# Patient Record
Sex: Female | Born: 1985 | Race: Black or African American | Hispanic: No | Marital: Single | Smoking: Never smoker
Health system: Southern US, Community
[De-identification: ages and names within clinical notes are randomized; demographics above are authoritative.]

---
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
## 2019-02-28 ENCOUNTER — Emergency Department
Admission: EM | Admit: 2019-02-28 | Discharge: 2019-02-28 | Disposition: A | Discharge status: Home / Self Care | Payer: Self-pay | Attending: Emergency Medicine | Admitting: Emergency Medicine

## 2019-02-28 ENCOUNTER — Other Ambulatory Visit: Payer: Self-pay

## 2019-02-28 DIAGNOSIS — Z203 Contact with and (suspected) exposure to rabies: Secondary | ICD-10-CM | POA: Insufficient documentation

## 2019-02-28 DIAGNOSIS — Z23 Encounter for immunization: Secondary | ICD-10-CM | POA: Insufficient documentation

## 2019-02-28 DIAGNOSIS — Z209 Contact with and (suspected) exposure to unspecified communicable disease: Secondary | ICD-10-CM | POA: Insufficient documentation

## 2019-02-28 MED ORDER — RABIES VACCINE, PCEC IM SUSR
1.0000 mL | Freq: Once | INTRAMUSCULAR | Status: AC
  Administered 2019-02-28: 11:00:00 1 mL via INTRAMUSCULAR
  Filled 2019-02-28: qty 1

## 2019-02-28 MED ORDER — RABIES IMMUNE GLOBULIN 150 UNIT/ML IM INJ
20.0000 [IU]/kg | INJECTION | Freq: Once | INTRAMUSCULAR | Status: AC
  Administered 2019-02-28: 1275 [IU] via INTRAMUSCULAR
  Filled 2019-02-28: qty 8.5

## 2019-02-28 NOTE — ED Triage Notes (Signed)
Pt states she is concerned due to a bat being in her room last night, it not sure if she was bitten or anything.

## 2019-02-28 NOTE — ED Notes (Signed)
See triage note  States she saw a bat fly out of her room   States she was not in the room and is not sure if she was exposed to anything  Was referred by health dept

## 2019-02-28 NOTE — Discharge Instructions (Addendum)
You are being treated empirically for a potential rabies exposure. You should return to the ED or [facility] according to the following schedule:  Day 0  (9/15) [facility] - Vaccine #1 + Immunoglobulin - Done Day 3  (9/18) - Vaccine #2 Day 7  (9/22) - Vaccine #3 Day 14 (9/29) - Vaccine #4

## 2019-02-28 NOTE — ED Provider Notes (Signed)
[facility] Emergency Department Provider Note ____________________________________________  Time seen: 67  I have reviewed the triage vital signs and the nursing notes.  HISTORY  Chief Complaint  Animal Bite  HPI Kathleen Stark is a 33 y.o. female presents with son to the ED with request for prophylactic rabies vaccine.  Patient had an exposure to a bat found in her bedroom over the weekend.  The bat was subsequently captured, not handled, but immediately released.  Patient does not have any evidence that she was bitten by the bat.  The bat is unavailable for testing at this time.  Patient was advised by the health department to present to the ED for exposure counseling.   History reviewed. No pertinent past medical history.  There are no active problems to display for this patient.   Past Surgical History:  Procedure Laterality Date  . CYST EXCISION  2006    Prior to Admission medications   Not on File    Allergies Patient has no known allergies.  No family history on file.  Social History Social History   Tobacco Use  . Smoking status: Never Smoker  . Smokeless tobacco: Never Used  Substance Use Topics  . Alcohol use: Yes  . Drug use: Not Currently    Review of Systems  Constitutional: Negative for fever. Eyes: Negative for visual changes. ENT: Negative for sore throat. Cardiovascular: Negative for chest pain. Respiratory: Negative for shortness of breath. Gastrointestinal: Negative for abdominal pain, vomiting and diarrhea. Genitourinary: Negative for dysuria. Musculoskeletal: Negative for back pain. Skin: Negative for rash. Neurological: Negative for headaches, focal weakness or numbness. ____________________________________________  PHYSICAL EXAM:  VITAL SIGNS: ED Triage Vitals  Enc Vitals Group     BP 02/28/19 0946 (!) 130/92     Pulse Rate 02/28/19 0946 66     Resp 02/28/19 0946 18     Temp 02/28/19 0946 98.5 F (36.9  C)     Temp Source 02/28/19 0946 Oral     SpO2 02/28/19 0946 100 %     Weight 02/28/19 0946 140 lb (63.5 kg)     Height 02/28/19 0946 5\' 1"  (1.549 m)     Head Circumference --      Peak Flow --      Pain Score 02/28/19 0950 0     Pain Loc --      Pain Edu? --      Excl. in [place]? --     Constitutional: Alert and oriented. Well appearing and in no distress. Head: Normocephalic and atraumatic. Eyes: Conjunctivae are normal. Normal extraocular movements Cardiovascular: Normal rate, regular rhythm. Normal distal pulses. Respiratory: Normal respiratory effort. No wheezes/rales/rhonchi. Musculoskeletal: Nontender with normal range of motion in all extremities.  Neurologic:  Normal gait without ataxia. Normal speech and language. No gross focal neurologic deficits are appreciated. Skin:  Skin is warm, dry and intact. No rash noted. ____________________________________________  PROCEDURES  Rabavert Ig 1275 units IM Rabavert 1 ml IM Procedures ____________________________________________  INITIAL IMPRESSION / ASSESSMENT AND PLAN / ED COURSE  Patient with ED evaluation for postexposure prophylaxis of rabies secondary to a bat exposure.  Patient denies any evidence of being bitten by the bat.  She has been counseled on rabies exposure and treatment.  She agrees to prophylax at this time.  She will follow-up as scheduled for subsequent vaccines.  Kathleen Stark was evaluated in Emergency Department on 02/28/2019 for the symptoms described in the history of present illness. She was evaluated in  the context of the global COVID-19 pandemic, which necessitated consideration that the patient might be at risk for infection with the SARS-CoV-2 virus that causes COVID-19. Institutional protocols and algorithms that pertain to the evaluation of patients at risk for COVID-19 are in a state of rapid change based on information released by regulatory bodies including the CDC and federal and state organizations.  These policies and algorithms were followed during the patient's care in the ED. ____________________________________________  FINAL CLINICAL IMPRESSION(S) / ED DIAGNOSES  Final diagnoses:  Need for post exposure prophylaxis for rabies  Exposure to bat without known bite      Carmie End, Dannielle Karvonen, PA-C 02/28/19 1028    Blake Divine, MD 03/01/19 [phone]

## 2019-03-03 ENCOUNTER — Encounter: Payer: Self-pay | Admitting: Emergency Medicine

## 2019-03-03 ENCOUNTER — Ambulatory Visit
Admission: EM | Admit: 2019-03-03 | Discharge: 2019-03-03 | Disposition: A | Discharge status: Home / Self Care | Payer: Self-pay

## 2019-03-03 ENCOUNTER — Other Ambulatory Visit: Payer: Self-pay

## 2019-03-03 DIAGNOSIS — Z23 Encounter for immunization: Secondary | ICD-10-CM

## 2019-03-03 DIAGNOSIS — Z203 Contact with and (suspected) exposure to rabies: Secondary | ICD-10-CM

## 2019-03-03 MED ORDER — RABIES VACCINE, PCEC IM SUSR
1.0000 mL | Freq: Once | INTRAMUSCULAR | Status: AC
  Administered 2019-03-03: 1 mL via INTRAMUSCULAR

## 2019-03-03 NOTE — ED Triage Notes (Signed)
Pt presents to [facility] for her day 3 rabies vaccine. Pt states she tolerated the first vaccines well.

## 2019-03-07 ENCOUNTER — Ambulatory Visit
Admission: EM | Admit: 2019-03-07 | Discharge: 2019-03-07 | Disposition: A | Discharge status: Home / Self Care | Payer: Self-pay | Attending: Family Medicine | Admitting: Family Medicine

## 2019-03-07 ENCOUNTER — Other Ambulatory Visit: Payer: Self-pay

## 2019-03-07 DIAGNOSIS — Z23 Encounter for immunization: Secondary | ICD-10-CM

## 2019-03-07 DIAGNOSIS — Z203 Contact with and (suspected) exposure to rabies: Secondary | ICD-10-CM

## 2019-03-07 MED ORDER — RABIES VACCINE, PCEC IM SUSR
1.0000 mL | Freq: Once | INTRAMUSCULAR | Status: AC
  Administered 2019-03-07: 09:00:00 1 mL via INTRAMUSCULAR

## 2019-03-07 NOTE — ED Triage Notes (Signed)
Patient is here for Day 7 Rabies vaccine. States that she has tolerated previous injections well.

## 2019-03-07 NOTE — Discharge Instructions (Signed)
Patient will follow up in 1 week for Day 14 Rabies Vaccine.

## 2019-03-14 ENCOUNTER — Other Ambulatory Visit: Payer: Self-pay

## 2019-03-14 ENCOUNTER — Ambulatory Visit
Admission: EM | Admit: 2019-03-14 | Discharge: 2019-03-14 | Disposition: A | Discharge status: Home / Self Care | Payer: Self-pay | Attending: Internal Medicine | Admitting: Internal Medicine

## 2019-03-14 MED ORDER — RABIES VACCINE, PCEC IM SUSR
1.0000 mL | Freq: Once | INTRAMUSCULAR | Status: AC
  Administered 2019-03-14: 1 mL via INTRAMUSCULAR

## 2019-03-14 NOTE — ED Triage Notes (Signed)
Patient here for Day 14 Rabies vaccination.

## 2019-10-16 ENCOUNTER — Other Ambulatory Visit: Payer: Self-pay

## 2019-10-16 ENCOUNTER — Ambulatory Visit: Payer: 59 | Admitting: Dermatology

## 2019-10-16 DIAGNOSIS — D2239 Melanocytic nevi of other parts of face: Secondary | ICD-10-CM | POA: Diagnosis not present

## 2019-10-16 DIAGNOSIS — D229 Melanocytic nevi, unspecified: Secondary | ICD-10-CM

## 2019-10-16 DIAGNOSIS — L700 Acne vulgaris: Secondary | ICD-10-CM | POA: Diagnosis not present

## 2019-10-16 MED ORDER — SPIRONOLACTONE 25 MG PO TABS: ORAL_TABLET | ORAL | 3 refills | Status: AC

## 2019-10-16 NOTE — Patient Instructions (Signed)
Spironolactone can cause increased urination and cause blood pressure to decrease. Please watch for signs of lightheadedness and be cautious when changing position. It can sometimes cause breast tenderness or an irregular period in premenopausal women. It can also increase potassium. The increase in potassium usually is not a concern unless you are taking other medicines that also increase potassium, so please be sure your doctor knows all of the other medications you are taking. This medication should not be taken  by pregnant women.  

## 2019-10-16 NOTE — Progress Notes (Signed)
   Follow-Up Visit   Subjective  Kathleen Stark is a 34 y.o. female who presents for the following: Acne (patient has noticed a decrease in the number of papules since starting Spironolactone. She is also using Dapsone QAM and Fabior foam QHS). He wonders about a mole on her cheek.  The following portions of the chart were reviewed this encounter and updated as appropriate:  Tobacco  Allergies  Meds  Problems  Med Hx  Surg Hx  Fam Hx     Review of Systems:  No other skin or systemic complaints except as noted in HPI or Assessment and Plan.  Objective  Well appearing patient in no apparent distress; mood and affect are within normal limits.  A focused examination was performed including the face. Relevant physical exam findings are noted in the Assessment and Plan.  Objective  Face: Minimal comedones, no active papules  Objective  L cheek: Brown papule   Assessment & Plan  Acne vulgaris Face  Patient has noticed a decrease in the number of papules since starting Spironolactone from about 4 papules a month to 1 a month - Increase Spironolactone 25 mg to 2 tabs po QD, continue Fabior foam QHS and Aczone gel QAM. Spironolactone can cause increased urination and cause blood pressure to decrease. Please watch for signs of lightheadedness and be cautious when changing position. It can sometimes cause breast tenderness or an irregular period in premenopausal women. It can also increase potassium. The increase in potassium usually is not a concern unless you are taking other medicines that also increase potassium, so please be sure your doctor knows all of the other medications you are taking. This medication should not be taken  by pregnant women.   spironolactone (ALDACTONE) 25 MG tablet - Face  Nevus L cheek  Benign, observe.    Return in about 3 months (around 01/16/2020).  Luther Redo, CMA, am acting as scribe for Sarina Ser, MD .   Documentation: I have reviewed  the above documentation for accuracy and completeness, and I agree with the above.  Sarina Ser, MD

## 2019-10-18 ENCOUNTER — Encounter: Payer: Self-pay | Admitting: Dermatology

## 2019-11-28 ENCOUNTER — Other Ambulatory Visit: Payer: Self-pay

## 2019-11-28 MED ORDER — DAPSONE 5 % EX GEL: CUTANEOUS | 5 refills | Status: AC

## 2020-01-09 ENCOUNTER — Other Ambulatory Visit: Payer: Self-pay | Admitting: Obstetrics and Gynecology

## 2020-01-09 DIAGNOSIS — N6320 Unspecified lump in the left breast, unspecified quadrant: Secondary | ICD-10-CM

## 2020-02-05 ENCOUNTER — Ambulatory Visit: Payer: 59 | Admitting: Dermatology

## 2020-02-15 ENCOUNTER — Other Ambulatory Visit: Payer: 59

## 2020-03-01 ENCOUNTER — Other Ambulatory Visit: Payer: 59

## 2020-03-15 ENCOUNTER — Other Ambulatory Visit: Payer: Self-pay

## 2020-03-15 ENCOUNTER — Ambulatory Visit
Admission: RE | Admit: 2020-03-15 | Discharge: 2020-03-15 | Disposition: A | Discharge status: Home / Self Care | Payer: 59 | Source: Ambulatory Visit | Attending: Obstetrics and Gynecology | Admitting: Obstetrics and Gynecology

## 2020-03-15 DIAGNOSIS — N6320 Unspecified lump in the left breast, unspecified quadrant: Secondary | ICD-10-CM

## 2020-04-22 ENCOUNTER — Ambulatory Visit: Payer: 59 | Admitting: Dermatology

## 2021-03-29 IMAGING — MG DIGITAL DIAGNOSTIC BILAT W/ TOMO W/ CAD
6 of 10 series · 6 of 30 positions shown · non-contrast
Comparison: Previous exam(s).

CLINICAL DATA: Patient complains a palpable left breast mass that
she states has been clinically stable for several years.

EXAM:
DIGITAL DIAGNOSTIC BILATERAL MAMMOGRAM WITH CAD AND TOMO
ULTRASOUND LEFT BREAST

[R MLO synth-2D]
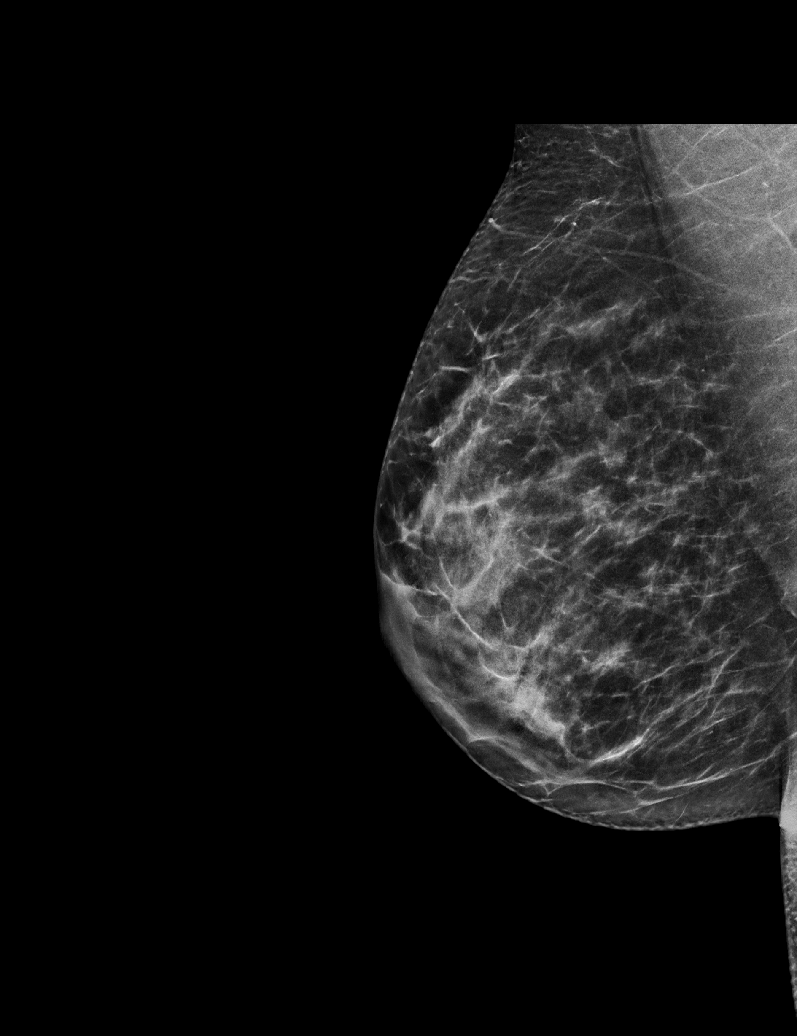

[L CC synth-2D]
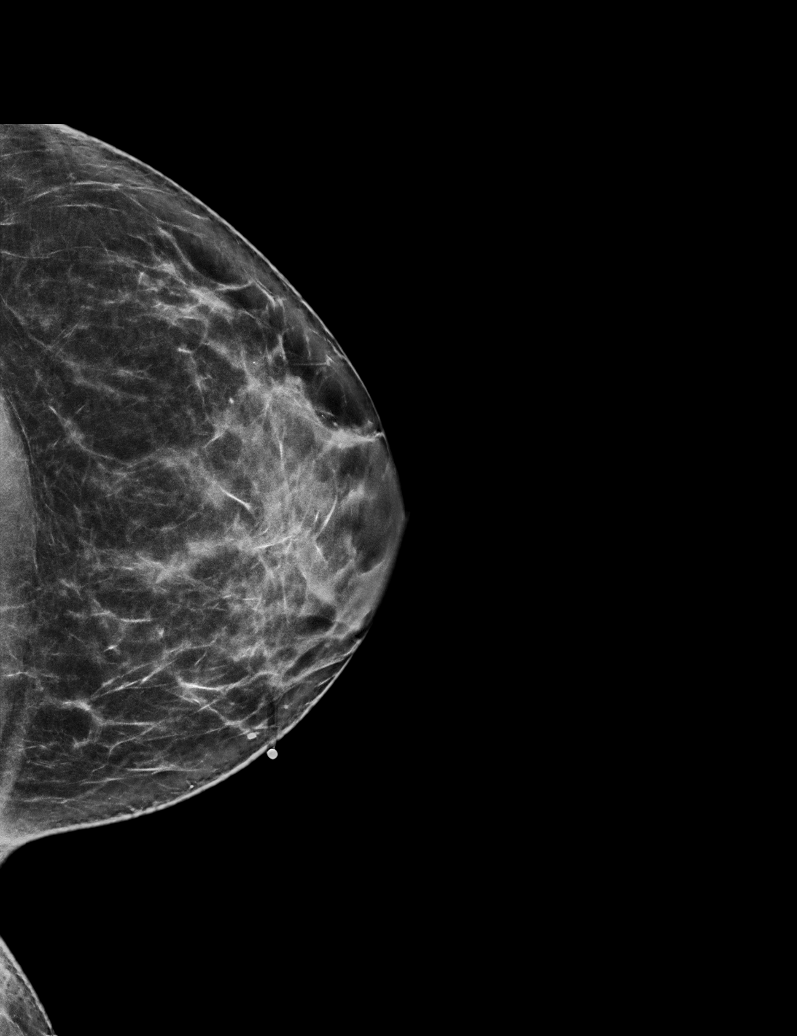

[R CC synth-2D]
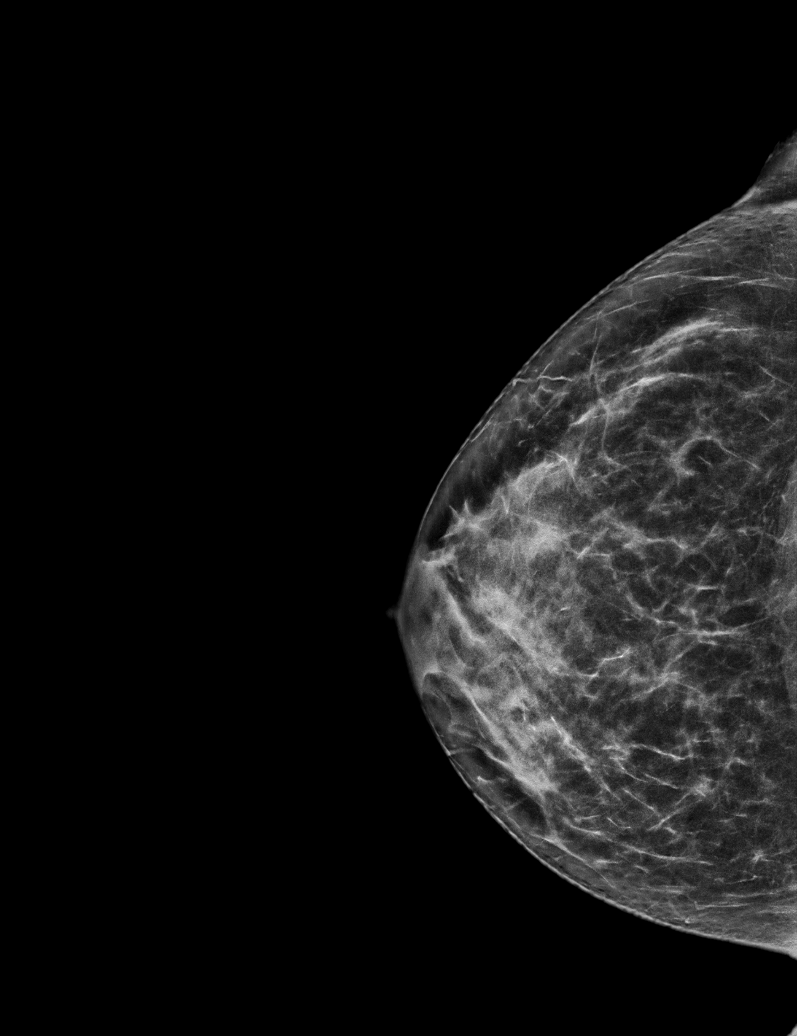

[L TAN synth-2D]
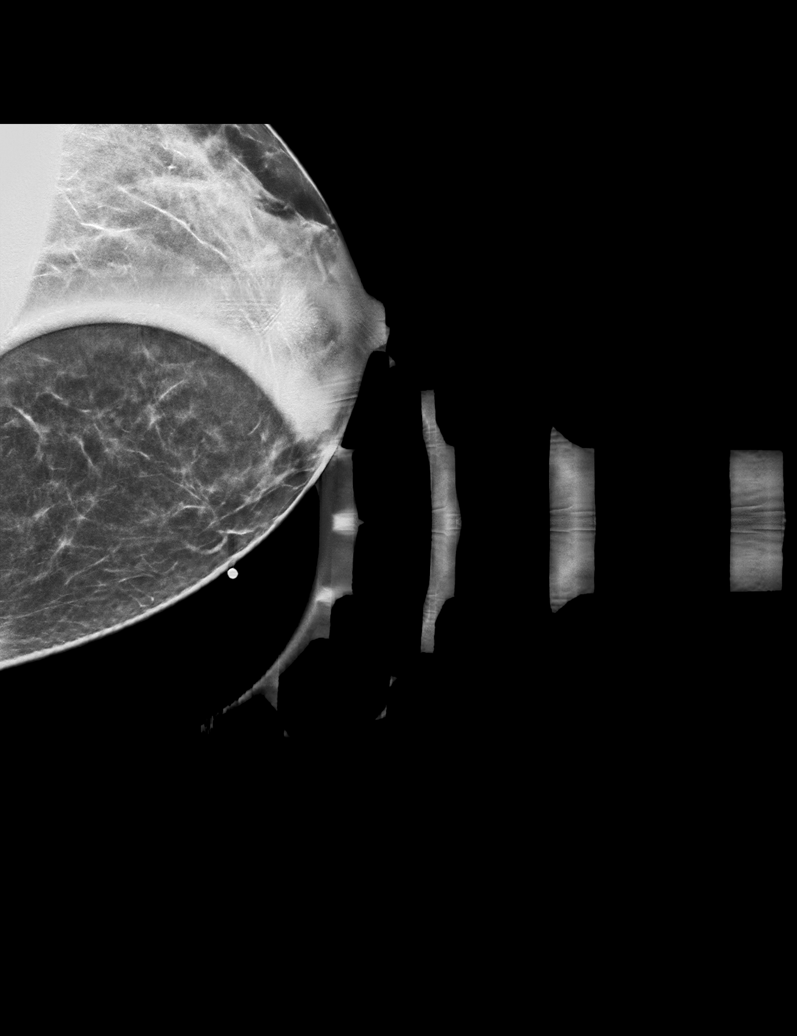

[L MLO synth-2D]
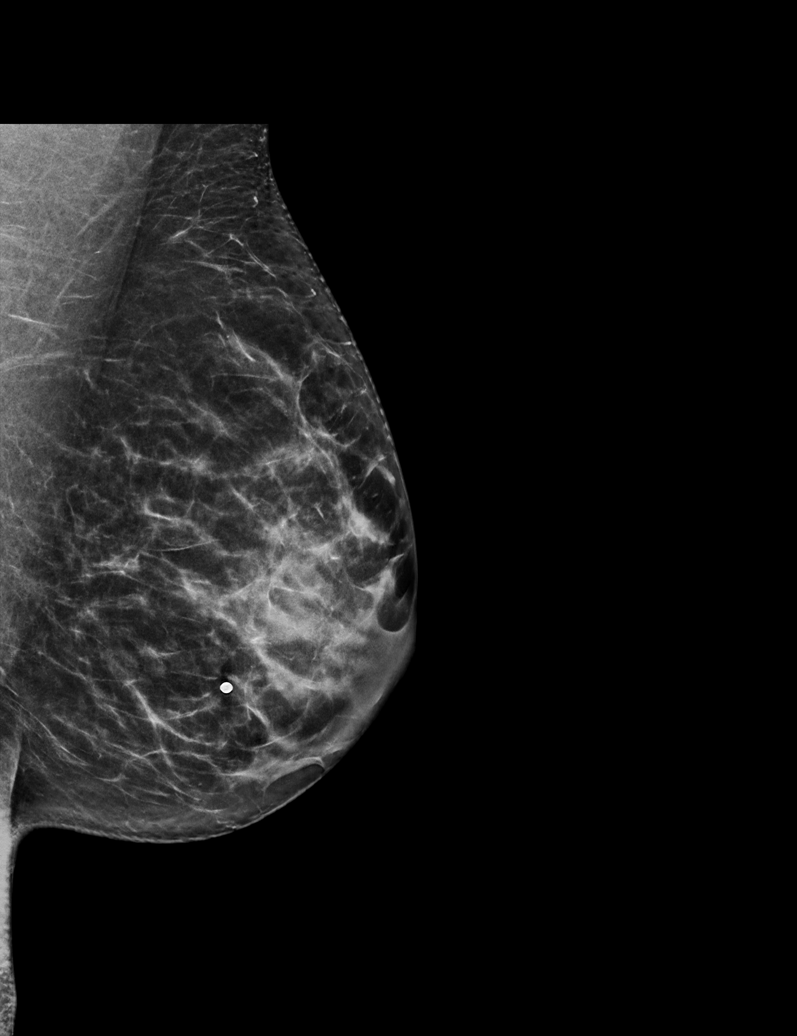

[R MLO tomo · tomo slice 31/61.0]
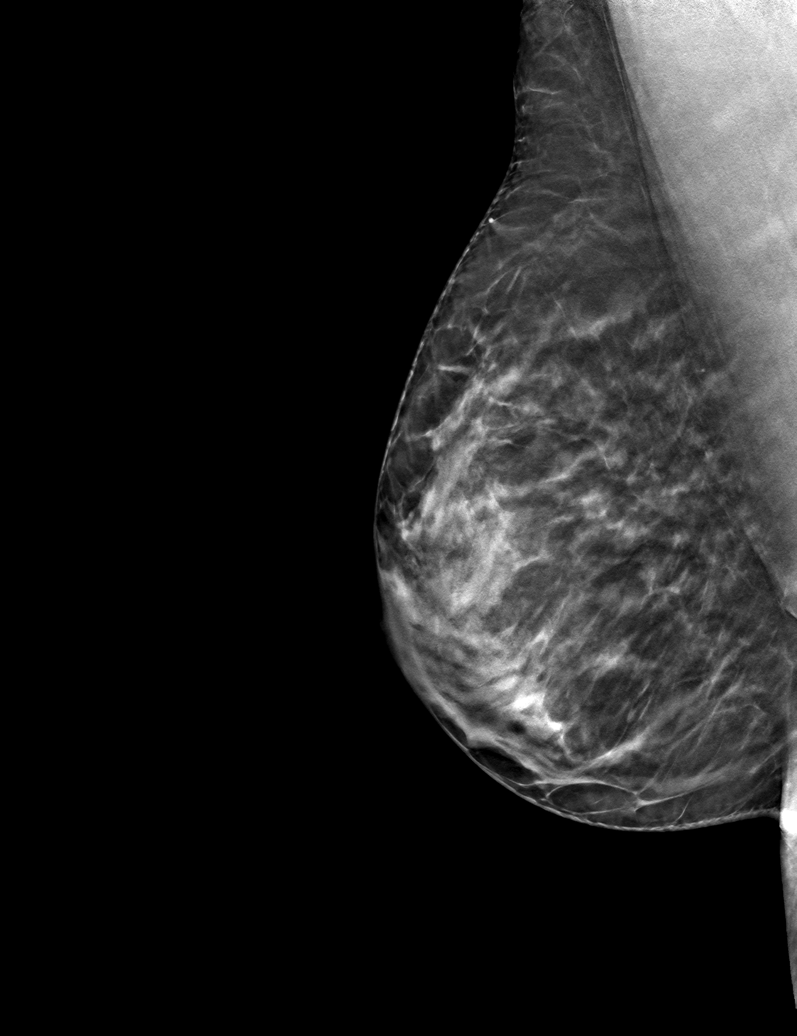

[6 of 30 positions shown; findings below may reference images not displayed]

ACR Breast Density Category c: The breast tissue is heterogeneously
dense, which may obscure small masses.
FINDINGS: No suspicious mass, malignant type microcalcifications or distortion
detected in either breast. Spot tangential view of the area of
clinical concern in the left breast shows normal fibroglandular
tissue.

Mammographic images were processed with CAD.

On physical exam, I palpate a focal area of thickening in the left
breast at 9 o'clock 1 cm from the nipple.

Targeted ultrasound is performed, showing normal tissue in the area
of clinical concern in the left breast at 9 o'clock 1 cm from the
nipple. No solid or cystic mass, abnormal shadowing or distortion
visualized.
IMPRESSION: No evidence of malignancy in either breast.

RECOMMENDATION:
Bilateral screening mammogram can be deferred until the age of 40 if
the clinical exam remains benign/stable.

I have discussed the findings and recommendations with the patient.
If applicable, a reminder letter will be sent to the patient
regarding the next appointment.

BI-RADS CATEGORY  1: Negative.
